# Patient Record
Sex: Female | Born: 2000 | Race: White | Hispanic: Yes | Marital: Single | Smoking: Never smoker
Health system: Southern US, Community
[De-identification: ages and names within clinical notes are randomized; demographics above are authoritative.]

## PROBLEM LIST (undated history)

## (undated) DIAGNOSIS — Z8781 Personal history of (healed) traumatic fracture: Secondary | ICD-10-CM

## (undated) DIAGNOSIS — T7840XA Allergy, unspecified, initial encounter: Secondary | ICD-10-CM

## (undated) HISTORY — DX: Allergy, unspecified, initial encounter: T78.40XA

## (undated) HISTORY — DX: Personal history of (healed) traumatic fracture: Z87.81

## (undated) HISTORY — PX: TONSILLECTOMY: SUR1361

---
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
## 2021-10-21 DIAGNOSIS — S93601A Unspecified sprain of right foot, initial encounter: Secondary | ICD-10-CM | POA: Diagnosis not present

## 2021-10-30 DIAGNOSIS — L930 Discoid lupus erythematosus: Secondary | ICD-10-CM | POA: Diagnosis not present

## 2021-10-30 DIAGNOSIS — Z79899 Other long term (current) drug therapy: Secondary | ICD-10-CM | POA: Diagnosis not present

## 2021-11-13 DIAGNOSIS — L308 Other specified dermatitis: Secondary | ICD-10-CM | POA: Diagnosis not present

## 2022-05-27 ENCOUNTER — Ambulatory Visit (INDEPENDENT_AMBULATORY_CARE_PROVIDER_SITE_OTHER): Payer: BC Managed Care – PPO | Admitting: Nurse Practitioner

## 2022-05-27 ENCOUNTER — Encounter: Payer: Self-pay | Admitting: Nurse Practitioner

## 2022-05-27 VITALS — BP 114/88 | HR 71 | Ht 62.75 in | Wt 201.2 lb

## 2022-05-27 DIAGNOSIS — E669 Obesity, unspecified: Secondary | ICD-10-CM | POA: Diagnosis not present

## 2022-05-27 DIAGNOSIS — R5383 Other fatigue: Secondary | ICD-10-CM

## 2022-05-27 DIAGNOSIS — Z1159 Encounter for screening for other viral diseases: Secondary | ICD-10-CM

## 2022-05-27 DIAGNOSIS — Z114 Encounter for screening for human immunodeficiency virus [HIV]: Secondary | ICD-10-CM

## 2022-05-27 DIAGNOSIS — Z23 Encounter for immunization: Secondary | ICD-10-CM

## 2022-05-27 DIAGNOSIS — R21 Rash and other nonspecific skin eruption: Secondary | ICD-10-CM | POA: Insufficient documentation

## 2022-05-27 MED ORDER — NORETHINDRONE ACET-ETHINYL EST 1-20 MG-MCG PO TABS: 1.0000 | ORAL_TABLET | Freq: Every day | ORAL | 3 refills | Status: AC

## 2022-05-27 NOTE — Patient Instructions (Signed)
It was great to see you!  We are checking your labs today and will let you know the results via mychart/phone.   Depending on your lab results, I may refer you to rheumatology.   Let's follow-up in 1 month, sooner if you have concerns.  If a referral was placed today, you will be contacted for an appointment. Please note that routine referrals can sometimes take up to 3-4 weeks to process. Please call our office if you haven't heard anything after this time frame.  Take care,  Rodman Pickle, NP

## 2022-05-27 NOTE — Assessment & Plan Note (Signed)
BMI 35.9.  Recommend healthy eating and exercise.  We will check A1c today

## 2022-05-27 NOTE — Assessment & Plan Note (Signed)
She is having an ongoing rash to her left cheek.  She saw dermatology and does not remember what cream she was given but she has been using this daily without relief.  She states that she had autoimmune testing done which came back positive, was told she did not need any more follow-up.  She is concerned about this because her sister has a history of lupus.  We will check ANA with reflex today.  If positive will refer to rheumatology.

## 2022-05-27 NOTE — Progress Notes (Signed)
New Patient Visit  BP 114/88 (BP Location: Left Arm, Patient Position: Sitting, Cuff Size: Normal)   Pulse 71   Ht 5' 2.75" (1.594 m)   Wt 201 lb 3.2 oz (91.3 kg)   LMP 05/21/2022 (Approximate)   SpO2 97%   BMI 35.93 kg/m    Subjective:    Patient ID: Brandy Cook, female    DOB: 2001/04/12, 21 y.o.   MRN: [id number]  CC: Chief Complaint  Patient presents with   Establish Care    Np. Est care. Pt wants to discuss recent dx of autoimmune disease. Pt c/o painful period cramps during menstrual cycle, discuss treatment options.     HPI: Brandy Cook is a 21 y.o. female presents for new patient visit to establish care.  Introduced to Publishing rights manager role and practice setting.  All questions answered.  Discussed provider/patient relationship and expectations.  She states that she has been having a rash on her left cheek. She was putting a cream on it, however it seems to have gotten bigger.  She does not remember the name of the medication.  She was following with dermatology and had some autoimmune disease checked and told that it was positive, however she was not told which one.  She was also told that she did not need anymore follow-up on this.  She is concerned because her sister has a history of lupus.  She has also been experiencing joint pain, hair loss, and fatigue.  She states that she has a history of heavy, painful periods.  Her menstrual periods last about a week, and days 3 and 4 are the heaviest.  She states that she cannot get out of bed some days.  She has been taking advil which helps for about an hour, then comes back.  Her menstrual periods are mostly regular, however sometimes they are a week or 2 late.  She is interested in starting a birth control medication to help with her heavy cramping.  Depression and anxiety screen done:     05/27/2022    3:19 PM 06/01/2019    3:10 PM 04/04/2018    9:34 AM 05/11/2016    5:44 PM 09/07/2015   12:05 PM  Depression  screen PHQ 2/9  Decreased Interest 1 0 0 0 0  Down, Depressed, Hopeless 1 0 0 0 0  PHQ - 2 Score 2 0 0 0 0  Altered sleeping 1      Tired, decreased energy 1      Change in appetite 1      Feeling bad or failure about yourself  1      Trouble concentrating 0      Moving slowly or fidgety/restless 0      Suicidal thoughts 0      PHQ-9 Score 6      Difficult doing work/chores Not difficult at all          05/27/2022    3:19 PM  GAD 7 : Generalized Anxiety Score  Nervous, Anxious, on Edge 1  Control/stop worrying 1  Worry too much - different things 1  Trouble relaxing 0  Restless 0  Easily annoyed or irritable 1  Afraid - awful might happen 1  Total GAD 7 Score 5  Anxiety Difficulty Not difficult at all    Past Medical History:  Diagnosis Date   Allergy    H/O forearm fracture 2013   Right arm    Past Surgical History:  Procedure Laterality Date  TONSILLECTOMY      Family History  Problem Relation Age of Onset   Anemia Mother    Alcohol abuse Father    Lupus Sister    Alcohol abuse Paternal Uncle    Diabetes Maternal Grandmother    Alcohol abuse Paternal Grandfather      Social History   Tobacco Use   Smoking status: Never   Smokeless tobacco: Never  Vaping Use   Vaping Use: Never used  Substance Use Topics   Alcohol use: No    Alcohol/week: 0.0 standard drinks of alcohol   Drug use: No    No current outpatient medications on file prior to visit.   No current facility-administered medications on file prior to visit.     Review of Systems  Constitutional:  Positive for fatigue. Negative for fever.  HENT: Negative.    Eyes:  Positive for visual disturbance (especially at night). Negative for pain and redness.  Respiratory: Negative.    Cardiovascular: Negative.   Gastrointestinal: Negative.   Genitourinary: Negative.   Musculoskeletal:  Positive for arthralgias. Negative for joint swelling.  Skin:  Positive for rash (left cheek).   Neurological: Negative.   Psychiatric/Behavioral: Negative.        Objective:    BP 114/88 (BP Location: Left Arm, Patient Position: Sitting, Cuff Size: Normal)   Pulse 71   Ht 5' 2.75" (1.594 m)   Wt 201 lb 3.2 oz (91.3 kg)   LMP 05/21/2022 (Approximate)   SpO2 97%   BMI 35.93 kg/m   Wt Readings from Last 3 Encounters:  05/27/22 201 lb 3.2 oz (91.3 kg)  06/01/19 173 lb 6.4 oz (78.7 kg) (94 %, Z= 1.59)*  04/04/18 163 lb (73.9 kg) (93 %, Z= 1.45)*   * Growth percentiles are based on CDC (Girls, 2-20 Years) data.    BP Readings from Last 3 Encounters:  05/27/22 114/88  06/01/19 128/72  04/04/18 126/74 (95 %, Z = 1.64 /  84 %, Z = 0.99)*   *BP percentiles are based on the 2017 AAP Clinical Practice Guideline for girls    Physical Exam Vitals and nursing note reviewed.  Constitutional:      General: She is not in acute distress.    Appearance: Normal appearance.  HENT:     Head: Normocephalic.     Right Ear: Tympanic membrane, ear canal and external ear normal.     Left Ear: Tympanic membrane, ear canal and external ear normal.  Eyes:     Conjunctiva/sclera: Conjunctivae normal.  Cardiovascular:     Rate and Rhythm: Normal rate and regular rhythm.     Pulses: Normal pulses.     Heart sounds: Normal heart sounds.  Pulmonary:     Effort: Pulmonary effort is normal.     Breath sounds: Normal breath sounds.  Abdominal:     Palpations: Abdomen is soft.     Tenderness: There is no abdominal tenderness.  Musculoskeletal:     Cervical back: Normal range of motion and neck supple. No tenderness.     Right lower leg: No edema.     Left lower leg: No edema.  Lymphadenopathy:     Cervical: No cervical adenopathy.  Skin:    General: Skin is warm.     Findings: Rash (left cheek, flat, red) present.  Neurological:     General: No focal deficit present.     Mental Status: She is alert and oriented to person, place, and time.  Psychiatric:  Mood and Affect: Mood  normal.        Behavior: Behavior normal.        Thought Content: Thought content normal.        Judgment: Judgment normal.       Assessment & Plan:   Problem List Items Addressed This Visit       Musculoskeletal and Integument   Rash - Primary    She is having an ongoing rash to her left cheek.  She saw dermatology and does not remember what cream she was given but she has been using this daily without relief.  She states that she had autoimmune testing done which came back positive, was told she did not need any more follow-up.  She is concerned about this because her sister has a history of lupus.  We will check ANA with reflex today.  If positive will refer to rheumatology.      Relevant Orders   CBC with Differential/Platelet   Comprehensive metabolic panel   ANA w/Reflex     Other   Obesity (BMI 30-39.9)    BMI 35.9.  Recommend healthy eating and exercise.  We will check A1c today      Relevant Orders   Hemoglobin A1c   Other Visit Diagnoses     Fatigue, unspecified type       We will check CMP, CBC, TSH today.  We are also checking ANA for the rash on her cheek.  Encouraged regular exercise and regular sleep   Relevant Orders   CBC with Differential/Platelet   Comprehensive metabolic panel   TSH   Screening for HIV (human immunodeficiency virus)       Screen HIV today   Relevant Orders   HIV Antibody (routine testing w rflx)   Encounter for hepatitis C screening test for low risk patient       Screen hepatitis C today   Relevant Orders   Hepatitis C antibody   Need for meningitis vaccination       First meningitis B vaccine given today.  We will need next vaccine 1 month.   Relevant Orders   Meningococcal B, OMV (Bexsero) (Completed)   Need for influenza vaccination       Flu vaccine given today   Relevant Orders   Flu Vaccine QUAD 6+ mos PF IM (Fluarix Quad PF) (Completed)        Follow up plan: Return in about 4 weeks (around 06/24/2022).

## 2022-05-28 LAB — COMPREHENSIVE METABOLIC PANEL
ALT: 28 U/L (ref 0–35)
AST: 20 U/L (ref 0–37)
Albumin: 4.9 g/dL (ref 3.5–5.2)
Alkaline Phosphatase: 77 U/L (ref 39–117)
BUN: 15 mg/dL (ref 6–23)
CO2: 23 mEq/L (ref 19–32)
Calcium: 10.1 mg/dL (ref 8.4–10.5)
Chloride: 103 mEq/L (ref 96–112)
Creatinine, Ser: 0.63 mg/dL (ref 0.40–1.20)
GFR: 127.74 mL/min (ref >60.00)
Glucose, Bld: 89 mg/dL (ref 70–99)
Potassium: 4 mEq/L (ref 3.5–5.1)
Sodium: 136 mEq/L (ref 135–145)
Total Bilirubin: 0.5 mg/dL (ref 0.2–1.2)
Total Protein: 7.1 g/dL (ref 6.0–8.3)

## 2022-05-28 LAB — CBC WITH DIFFERENTIAL/PLATELET
Basophils Absolute: 0.1 10*3/uL (ref 0.0–0.1)
Basophils Relative: 0.8 % (ref 0.0–3.0)
Eosinophils Absolute: 0 10*3/uL (ref 0.0–0.7)
Eosinophils Relative: 0.7 % (ref 0.0–5.0)
HCT: 41.4 % (ref 36.0–46.0)
Hemoglobin: 14.1 g/dL (ref 12.0–15.0)
Lymphocytes Relative: 22.1 % (ref 12.0–46.0)
Lymphs Abs: 1.6 10*3/uL (ref 0.7–4.0)
MCHC: 34 g/dL (ref 30.0–36.0)
MCV: 81.1 fl (ref 78.0–100.0)
Monocytes Absolute: 0.5 10*3/uL (ref 0.1–1.0)
Monocytes Relative: 7.2 % (ref 3.0–12.0)
Neutro Abs: 4.9 10*3/uL (ref 1.4–7.7)
Neutrophils Relative %: 69.2 % (ref 43.0–77.0)
Platelets: 393 10*3/uL (ref 150.0–400.0)
RBC: 5.11 Mil/uL (ref 3.87–5.11)
RDW: 12.6 % (ref 11.5–14.6)
WBC: 7.1 10*3/uL (ref 4.5–10.5)

## 2022-05-28 LAB — ANA W/REFLEX: Anti Nuclear Antibody (ANA): NEGATIVE

## 2022-05-28 LAB — HIV ANTIBODY (ROUTINE TESTING W REFLEX): HIV 1&2 Ab, 4th Generation: NONREACTIVE

## 2022-05-28 LAB — HEPATITIS C ANTIBODY: Hepatitis C Ab: NONREACTIVE

## 2022-05-28 LAB — TSH: TSH: 2.4 u[IU]/mL (ref 0.35–5.50)

## 2022-05-28 LAB — HEMOGLOBIN A1C: Hgb A1c MFr Bld: 5.2 % (ref 4.6–6.5)

## 2022-06-24 ENCOUNTER — Telehealth: Payer: Self-pay

## 2022-06-24 ENCOUNTER — Encounter: Payer: Self-pay | Admitting: Nurse Practitioner

## 2022-06-24 ENCOUNTER — Ambulatory Visit: Payer: BC Managed Care – PPO | Admitting: Nurse Practitioner

## 2022-06-24 ENCOUNTER — Other Ambulatory Visit (HOSPITAL_COMMUNITY): Payer: Self-pay

## 2022-06-24 VITALS — BP 112/78 | HR 84 | Temp 97.8°F | Wt 203.4 lb

## 2022-06-24 DIAGNOSIS — R768 Other specified abnormal immunological findings in serum: Secondary | ICD-10-CM | POA: Diagnosis not present

## 2022-06-24 DIAGNOSIS — R21 Rash and other nonspecific skin eruption: Secondary | ICD-10-CM

## 2022-06-24 MED ORDER — TACROLIMUS 0.03 % EX OINT: TOPICAL_OINTMENT | Freq: Two times a day (BID) | CUTANEOUS | 0 refills | Status: AC

## 2022-06-24 NOTE — Assessment & Plan Note (Signed)
The rash is still ongoing on her face.  She does not remember the name of the cream that her dermatologist gave her but it did not help.  Will have her try tacrolimus cream to her face twice a day.  She also has a positive ANA on 10/30/2021, however the reflex was all negative.  Her titer was 1: 80 and it showed nuclear, speckled for description.  Will place referral to rheumatology.

## 2022-06-24 NOTE — Telephone Encounter (Signed)
Pharmacy Patient Advocate Encounter   Received notification from [facility] that prior authorization for Tacrolimus 0.03% ointment is required/requested.   PA submitted on 06/24/22 to [facility] [employer] via CoverMyMeds Key [id number]   Status is pending

## 2022-06-24 NOTE — Patient Instructions (Signed)
It was great to see you!  Start tacrolimus ointment twice a day to the spot on your face. I have placed a referral to rheumatology, they will call to schedule.   Let's follow-up in 3 months, sooner if you have concerns.  If a referral was placed today, you will be contacted for an appointment. Please note that routine referrals can sometimes take up to 3-4 weeks to process. Please call our office if you haven't heard anything after this time frame.  Take care,  Rodman Pickle, NP

## 2022-06-24 NOTE — Progress Notes (Signed)
   Established Patient Office Visit  Subjective   Patient ID: Brandy Cook, female    DOB: 12-22-2000  Age: 21 y.o. MRN: [id number]  Chief Complaint  Patient presents with   Follow-up    HPI  Brandy Cook is here to follow-up on the rash on her face. She states that the cream the dermatologist gave her is not improving her symptoms.  She states that last week and was really cold, the rash turned purple.  It does not itch or hurt.  She had a ANA that was positive on 10/30/2021.    ROS See pertinent positives and negatives per HPI.    Objective:     BP 112/78 (BP Location: Right Arm, Patient Position: Sitting, Cuff Size: Large)   Pulse 84   Temp 97.8 F (36.6 C) (Temporal)   Wt 203 lb 6.4 oz (92.3 kg)   LMP 06/22/2022 (Approximate)   SpO2 97%   BMI 36.32 kg/m    Physical Exam Vitals and nursing note reviewed.  Constitutional:      General: She is not in acute distress.    Appearance: Normal appearance.  HENT:     Head: Normocephalic.  Eyes:     Conjunctiva/sclera: Conjunctivae normal.  Cardiovascular:     Rate and Rhythm: Normal rate and regular rhythm.     Pulses: Normal pulses.     Heart sounds: Normal heart sounds.  Pulmonary:     Effort: Pulmonary effort is normal.     Breath sounds: Normal breath sounds.  Musculoskeletal:     Cervical back: Normal range of motion.  Skin:    General: Skin is warm.     Findings: Rash (left cheek) present.  Neurological:     General: No focal deficit present.     Mental Status: She is alert and oriented to person, place, and time.  Psychiatric:        Mood and Affect: Mood normal.        Behavior: Behavior normal.        Thought Content: Thought content normal.        Judgment: Judgment normal.      Assessment & Plan:   Problem List Items Addressed This Visit       Musculoskeletal and Integument   Rash - Primary    The rash is still ongoing on her face.  She does not remember the name of the cream that her  dermatologist gave her but it did not help.  Will have her try tacrolimus cream to her face twice a day.  She also has a positive ANA on 10/30/2021, however the reflex was all negative.  Her titer was 1: 80 and it showed nuclear, speckled for description.  Will place referral to rheumatology.      Relevant Orders   Ambulatory referral to Rheumatology   Other Visit Diagnoses     Positive ANA (antinuclear antibody)       Relevant Orders   Ambulatory referral to Rheumatology       Return in about 3 months (around 09/23/2022) for CPE.    Gerre Scull, NP

## 2022-06-25 ENCOUNTER — Other Ambulatory Visit (HOSPITAL_COMMUNITY): Payer: Self-pay

## 2022-06-25 NOTE — Telephone Encounter (Signed)
Pharmacy Patient Advocate Encounter  Prior Authorization for Tacrolimus 0.03% Ointment has been approved.    PA# [id number] Effective dates: 06/24/2022 through 06/23/2023

## 2022-12-29 NOTE — Progress Notes (Deleted)
Office Visit Note  Patient: Brandy Cook             Date of Birth: 2001-01-28           MRN: [id number]             PCP: Patient, No Pcp Per Referring: Gerre Scull, NP Visit Date: 01/12/2023 Occupation: @GUAROCC @  Subjective:  No chief complaint on file.   History of Present Illness: Brandy Cook is a 22 y.o. female ***     Activities of Daily Living:  Patient reports morning stiffness for *** {minute/hour:19697}.   Patient {ACTIONS;DENIES/REPORTS:21021675::"Denies"} nocturnal pain.  Difficulty dressing/grooming: {ACTIONS;DENIES/REPORTS:21021675::"Denies"} Difficulty climbing stairs: {ACTIONS;DENIES/REPORTS:21021675::"Denies"} Difficulty getting out of chair: {ACTIONS;DENIES/REPORTS:21021675::"Denies"} Difficulty using hands for taps, buttons, cutlery, and/or writing: {ACTIONS;DENIES/REPORTS:21021675::"Denies"}  No Rheumatology ROS completed.   PMFS History:  Patient Active Problem List   Diagnosis Date Noted   Rash 05/27/2022   Obesity (BMI 30-39.9) 05/27/2022    Past Medical History:  Diagnosis Date   Allergy    H/O forearm fracture 2013   Right arm    Family History  Problem Relation Age of Onset   Anemia Mother    Alcohol abuse Father    Lupus Sister    Alcohol abuse Paternal Uncle    Diabetes Maternal Grandmother    Alcohol abuse Paternal Grandfather    Past Surgical History:  Procedure Laterality Date   TONSILLECTOMY     Social History   Social History Narrative   Not on file   Immunization History  Administered Date(s) Administered   COVID-19, mRNA, vaccine(Comirnaty)12 years and older 10/25/2019, 05/28/2020   DTaP 03/22/2002, 05/23/2002, 07/04/2002, 04/09/2003, 03/01/2006   HIB (PRP-OMP) 03/22/2002, 05/23/2002, 07/04/2002, 01/15/2003   HPV 9-valent 04/25/2019, 08/11/2019, 12/06/2019   Hepatitis A, Ped/Adol-2 Dose 03/01/2006, 02/02/2007   Hepatitis B, PED/ADOLESCENT 11/01/01, 10/12/2002   Influenza,inj,Quad PF,6+ Mos  05/11/2016, 04/04/2018, 05/27/2022   Influenza-Unspecified 04/14/2019   MMR 01/15/2003, 03/01/2006   MenQuadfi_Meningococcal Groups ACYW Conjugate 03/27/2014, 04/25/2019   Meningococcal B, OMV 05/27/2022   Pneumococcal Conjugate-13 07/04/2002, 04/09/2003   Pneumococcal-Unspecified 03/22/2002, 05/23/2002   Td 08/12/2012   Tdap 08/12/2012   Vaccinia,smallpox Monkeypox Vaccine Live,pf 04/09/2003, 03/01/2006     Objective: Vital Signs: There were no vitals taken for this visit.   Physical Exam   Musculoskeletal Exam: ***  CDAI Exam: CDAI Score: -- Patient Global: --; Provider Global: -- Swollen: --; Tender: -- Joint Exam 01/12/2023   No joint exam has been documented for this visit   There is currently no information documented on the homunculus. Go to the Rheumatology activity and complete the homunculus joint exam.  Investigation: No additional findings.  Imaging: No results found.  Recent Labs: Lab Results  Component Value Date   WBC 7.1 05/27/2022   HGB 14.1 05/27/2022   PLT 393.0 05/27/2022   NA 136 05/27/2022   K 4.0 05/27/2022   CL 103 05/27/2022   CO2 23 05/27/2022   GLUCOSE 89 05/27/2022   BUN 15 05/27/2022   CREATININE 0.63 05/27/2022   BILITOT 0.5 05/27/2022   ALKPHOS 77 05/27/2022   AST 20 05/27/2022   ALT 28 05/27/2022   PROT 7.1 05/27/2022   ALBUMIN 4.9 05/27/2022   CALCIUM 10.1 05/27/2022    Speciality Comments: No specialty comments available.  Procedures:  No procedures performed Allergies: Penicillins   Assessment / Plan:     Visit Diagnoses: Rash  Positive ANA (antinuclear antibody) - ANA negative on 05/27/22  Orders: No orders of the defined types  were placed in this encounter.  No orders of the defined types were placed in this encounter.   Face-to-face time spent with patient was *** minutes. Greater than 50% of time was spent in counseling and coordination of care.  Follow-Up Instructions: No follow-ups on file.   Gearldine Bienenstock, PA-C  Note - This record has been created using Dragon software.  Chart creation errors have been sought, but may not always  have been located. Such creation errors do not reflect on  the standard of medical care.

## 2023-01-12 ENCOUNTER — Encounter: Payer: BC Managed Care – PPO | Admitting: Rheumatology

## 2023-01-12 DIAGNOSIS — R21 Rash and other nonspecific skin eruption: Secondary | ICD-10-CM

## 2023-01-12 DIAGNOSIS — R768 Other specified abnormal immunological findings in serum: Secondary | ICD-10-CM

## 2023-01-26 NOTE — Progress Notes (Addendum)
Office Visit Note  Patient: Brandy Cook             Date of Birth: Oct 05, 2000           MRN: [id number]             PCP: Patient, No Pcp Per Referring: Gerre Scull, NP Visit Date: 02/03/2023 Occupation: @GUAROCC @  Subjective:  Rash and positive ANA  History of Present Illness: Brandy Cook is a 21 y.o. female seen in consultation per request of her PCP.  According the patient in 2022 she noticed a rash on her left cheek she initially thought it was an insect bite.  She states over the next year the rash is spread and became larger.  She went to see [facility] dermatology about 1 year ago.  She states at that time she had lab work but no diagnosis was given.  She was also given a topical agent to use.  After that she started seeing her primary care physician and had lab work again.  She initially had positive ANA which became negative later.  She was given tacrolimus topical agent which she has been using November 2023 but has not noticed any improvement.  She states in March 2024 she went to [place] where she had laser treatment on the rash x 2 she noticed that the rash became less purple and appeared more pink.  She was also given some topical agents which she has been using occasionally.  She denies any other rash.  There is no history of photosensitivity.  She notices some stiffness in her bilateral wrist joints and her feet.  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  She likes to walk for exercise.  There is no history of oral ulcers, nasal ulcers, sicca symptoms, malar rash, photosensitivity, Raynaud's phenomenon or lymphadenopathy.  There is no history of inflammatory arthritis.  Her sister was diagnosed with systemic lupus at age 1.  There are no other family members with lupus or autoimmune disease.  She is gravida 0.  She is on oral contraceptive pills.    Activities of Daily Living:  Patient  reports morning stiffness for 10 minutes.   Patient Reports nocturnal pain.  Difficulty dressing/grooming: Denies Difficulty climbing stairs: Reports Difficulty getting out of chair: Reports Difficulty using hands for taps, buttons, cutlery, and/or writing: Denies  Review of Systems  Constitutional:  Positive for fatigue.  HENT:  Negative for mouth sores and mouth dryness.   Eyes:  Negative for dryness.  Respiratory:  Negative for shortness of breath.   Cardiovascular:  Negative for chest pain and palpitations.  Gastrointestinal:  Negative for blood in stool, constipation and diarrhea.  Endocrine: Negative for increased urination.  Genitourinary:  Negative for involuntary urination.  Musculoskeletal:  Positive for joint pain, joint pain and morning stiffness. Negative for gait problem, joint swelling, myalgias, muscle weakness, muscle tenderness and myalgias.  Skin:  Positive for rash and hair loss. Negative for color change and sensitivity to sunlight.  Allergic/Immunologic: Negative for susceptible to infections.  Neurological:  Negative for dizziness and headaches.  Hematological:  Negative for swollen glands.  Psychiatric/Behavioral:  Positive for sleep disturbance. Negative for depressed mood. The patient is not nervous/anxious.     PMFS History:  Patient Active Problem List   Diagnosis Date Noted   Rash 05/27/2022   Obesity (BMI 30-39.9) 05/27/2022    Past Medical  History:  Diagnosis Date   Allergy    H/O forearm fracture 2013   Right arm    Family History  Problem Relation Age of Onset   Anemia Mother    Alcohol abuse Father    Lupus Sister    Alcohol abuse Paternal Uncle    Diabetes Maternal Grandmother    Alcohol abuse Paternal Grandfather    Past Surgical History:  Procedure Laterality Date   TONSILLECTOMY     Social History   Social History Narrative   Not on file   Immunization History  Administered Date(s) Administered   COVID-19, mRNA,  vaccine(Comirnaty)12 years and older 10/25/2019, 05/28/2020   DTaP 03/22/2002, 05/23/2002, 07/04/2002, 04/09/2003, 03/01/2006   HIB (PRP-OMP) 03/22/2002, 05/23/2002, 07/04/2002, 01/15/2003   HPV 9-valent 04/25/2019, 08/11/2019, 12/06/2019   Hepatitis A, Ped/Adol-2 Dose 03/01/2006, 02/02/2007   Hepatitis B, PED/ADOLESCENT 05/02/2002, 10/12/2002   Influenza,inj,Quad PF,6+ Mos 05/11/2016, 04/04/2018, 05/27/2022   Influenza-Unspecified 04/14/2019   MMR 01/15/2003, 03/01/2006   MenQuadfi_Meningococcal Groups ACYW Conjugate 03/27/2014, 04/25/2019   Meningococcal B, OMV 05/27/2022   Pneumococcal Conjugate-13 07/04/2002, 04/09/2003   Pneumococcal-Unspecified 03/22/2002, 05/23/2002   Td 08/12/2012   Tdap 08/12/2012   Vaccinia,smallpox Monkeypox Vaccine Live,pf 04/09/2003, 03/01/2006     Objective: Vital Signs: BP 129/82 (BP Location: Right Arm, Patient Position: Sitting, Cuff Size: Normal)   Pulse (!) 59   Resp 15   Ht 5' 2.5" (1.588 m)   Wt 201 lb 3.2 oz (91.3 kg)   BMI 36.21 kg/m    Physical Exam Vitals and nursing note reviewed.  Constitutional:      Appearance: She is well-developed.  HENT:     Head: Normocephalic and atraumatic.  Eyes:     Conjunctiva/sclera: Conjunctivae normal.  Cardiovascular:     Rate and Rhythm: Normal rate and regular rhythm.     Heart sounds: Normal heart sounds.  Pulmonary:     Effort: Pulmonary effort is normal.     Breath sounds: Normal breath sounds.  Abdominal:     General: Bowel sounds are normal.     Palpations: Abdomen is soft.  Musculoskeletal:     Cervical back: Normal range of motion.  Lymphadenopathy:     Cervical: No cervical adenopathy.  Skin:    General: Skin is warm and dry.     Capillary Refill: Capillary refill takes less than 2 seconds.     Comments: Erythematous macular rash was noted on the left cheek with irregular margins.    Neurological:     Mental Status: She is alert and oriented to person, place, and time.   Psychiatric:        Behavior: Behavior normal.      Musculoskeletal Exam: Cervical, lumbar spine were in good range of motion.  Shoulder joints, elbow joints, wrist joints, MCPs PIPs and DIPs were in good range of motion with no synovitis.  Hip joints, knee joints, ankles, MTPs and PIPs been good range of motion with no synovitis.  CDAI Exam: CDAI Score: -- Patient Global: --; Provider Global: -- Swollen: --; Tender: -- Joint Exam 02/03/2023   No joint exam has been documented for this visit   There is currently no information documented on the homunculus. Go to the Rheumatology activity and complete the homunculus joint exam.  Investigation: No additional findings.  Imaging: No results found.  Recent Labs: Lab Results  Component Value Date   WBC 7.1 05/27/2022   HGB 14.1 05/27/2022   PLT 393.0 05/27/2022   NA 136 05/27/2022  K 4.0 05/27/2022   CL 103 05/27/2022   CO2 23 05/27/2022   GLUCOSE 89 05/27/2022   BUN 15 05/27/2022   CREATININE 0.63 05/27/2022   BILITOT 0.5 05/27/2022   ALKPHOS 77 05/27/2022   AST 20 05/27/2022   ALT 28 05/27/2022   PROT 7.1 05/27/2022   ALBUMIN 4.9 05/27/2022   CALCIUM 10.1 05/27/2022   May 27, 2022 ANA negative, hepatitis C negative, HIV negative, TSH normal, hemoglobin A1c 5.2  October 30, 2021 ANA 1: 80NS, ENA negative per office visit note.  I do not have the lab results available.  Speciality Comments: No specialty comments available.  Procedures:  No procedures performed Allergies: Penicillins   Assessment / Plan:     Visit Diagnoses: Rash-patient noticed a rash on her left cheek in 2022.  She states the rash gradually increased in size.  In 2023 she was seen at [facility] dermatology.  Patient states that she was given no diagnosis and some labs were obtained.  She was given an topical agent which did not help.  Patient did not go for follow-up visit.  She has not noticed rash anywhere on her body.  There is no history of  photosensitivity or Raynaud's.  Patient went to [place] in March 2024.  She states she was seen by a doctor there who treated the rash with laser x 2 and the rash became less purple.  She was also given 2 different topical agents which she has been using occasionally.  She continues to use the tacrolimus which was prescribed by her PCP.  She also uses sunscreen.  I will request records from [facility] dermatology.  Positive ANA (antinuclear antibody) -patient had low titer positive ANA in April 2023 which became negative.  She is concerned about lupus as her sister has lupus.  She denies any history of oral ulcers, nasal ulcers, sicca symptoms, malar rash, Raynaud's phenomenon, photosensitivity or lymphadenopathy.  There is no history of inflammatory arthritis.  She states she has had more hair loss in the last 1 year.  No hair thinning was noted.  She also gives history of fatigue.  Will repeat autoimmune labs today.  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   She states she has some discomfort in her ankles and her bilateral wrist joints towards the end of the day.  She also has morning stiffness.  No synovitis was noted on the examination today.  Plan: Rheumatoid factor, Cyclic citrul peptide antibody, IgG  Other fatigue -she complains of ongoing fatigue for the last few years.  Plan: CBC with Differential/Platelet, COMPLETE METABOLIC PANEL WITH GFR, Sedimentation rate, CK  Family history of systemic lupus erythematosus-sister.  Patient does not know the details of her sister's symptoms and treatment.    Orders: Orders Placed This Encounter  Procedures   CBC with Differential/Platelet   COMPLETE METABOLIC PANEL WITH GFR    Sedimentation rate   CK   Rheumatoid factor   Cyclic citrul peptide antibody, IgG   ANA   Anti-DNA antibody, double-stranded   Sjogrens syndrome-B extractable nuclear antibody   Sjogrens syndrome-A extractable nuclear antibody   Anti-Smith antibody   RNP Antibody   Anti-scleroderma antibody   C3 and C4   No orders of the defined types were placed in this encounter.  Follow-Up Instructions: Return for Rash, positive ANA.   Pollyann Savoy, MD  Note - This record has been created using Animal nutritionist.  Chart creation errors have been sought, but may not always  have been located. Such creation errors do not reflect on  the standard of medical care.

## 2023-02-03 ENCOUNTER — Encounter: Payer: Self-pay | Admitting: Rheumatology

## 2023-02-03 ENCOUNTER — Ambulatory Visit: Payer: BC Managed Care – PPO | Attending: Rheumatology | Admitting: Rheumatology

## 2023-02-03 VITALS — BP 129/82 | HR 59 | Resp 15 | Ht 62.5 in | Wt 201.2 lb

## 2023-02-03 DIAGNOSIS — R5383 Other fatigue: Secondary | ICD-10-CM | POA: Diagnosis not present

## 2023-02-03 DIAGNOSIS — R768 Other specified abnormal immunological findings in serum: Secondary | ICD-10-CM

## 2023-02-03 DIAGNOSIS — R21 Rash and other nonspecific skin eruption: Secondary | ICD-10-CM | POA: Diagnosis not present

## 2023-02-03 DIAGNOSIS — M2550 Pain in unspecified joint: Secondary | ICD-10-CM

## 2023-02-03 DIAGNOSIS — Z8269 Family history of other diseases of the musculoskeletal system and connective tissue: Secondary | ICD-10-CM

## 2023-02-04 ENCOUNTER — Ambulatory Visit: Payer: BC Managed Care – PPO | Admitting: Rheumatology

## 2023-02-05 LAB — COMPLETE METABOLIC PANEL WITH GFR
AG Ratio: 2 (calc) (ref 1.0–2.5)
ALT: 13 U/L (ref 6–29)
AST: 13 U/L (ref 10–30)
Albumin: 4.6 g/dL (ref 3.6–5.1)
Alkaline phosphatase (APISO): 69 U/L (ref 31–125)
BUN: 12 mg/dL (ref 7–25)
CO2: 23 mmol/L (ref 20–32)
Calcium: 9.7 mg/dL (ref 8.6–10.2)
Chloride: 107 mmol/L (ref 98–110)
Creat: 0.67 mg/dL (ref 0.50–0.96)
Globulin: 2.3 g/dL (calc) (ref 1.9–3.7)
Glucose, Bld: 87 mg/dL (ref 65–99)
Potassium: 4.3 mmol/L (ref 3.5–5.3)
Sodium: 139 mmol/L (ref 135–146)
Total Bilirubin: 0.4 mg/dL (ref 0.2–1.2)
Total Protein: 6.9 g/dL (ref 6.1–8.1)
eGFR: 127 mL/min/{1.73_m2} (ref >=60)

## 2023-02-05 LAB — CBC WITH DIFFERENTIAL/PLATELET
Absolute Monocytes: 369 cells/uL (ref 200–950)
Basophils Absolute: 11 cells/uL (ref 0–200)
Basophils Relative: 0.2 %
Eosinophils Absolute: 50 cells/uL (ref 15–500)
Eosinophils Relative: 0.9 %
HCT: 41.3 % (ref 35.0–45.0)
Hemoglobin: 14 g/dL (ref 11.7–15.5)
Lymphs Abs: 1656 cells/uL (ref 850–3900)
MCH: 27.9 pg (ref 27.0–33.0)
MCHC: 33.9 g/dL (ref 32.0–36.0)
MCV: 82.3 fL (ref 80.0–100.0)
MPV: 9.4 fL (ref 7.5–12.5)
Monocytes Relative: 6.7 %
Neutro Abs: 3416 cells/uL (ref 1500–7800)
Neutrophils Relative %: 62.1 %
Platelets: 333 10*3/uL (ref 140–400)
RBC: 5.02 10*6/uL (ref 3.80–5.10)
RDW: 12.2 % (ref 11.0–15.0)
Total Lymphocyte: 30.1 %
WBC: 5.5 10*3/uL (ref 3.8–10.8)

## 2023-02-05 LAB — SJOGRENS SYNDROME-B EXTRACTABLE NUCLEAR ANTIBODY: SSB (La) (ENA) Antibody, IgG: <1 AI

## 2023-02-05 LAB — ANTI-SCLERODERMA ANTIBODY: Scleroderma (Scl-70) (ENA) Antibody, IgG: <1 AI

## 2023-02-05 LAB — SEDIMENTATION RATE: Sed Rate: 6 mm/h (ref 0–20)

## 2023-02-05 LAB — ANTI-DNA ANTIBODY, DOUBLE-STRANDED: ds DNA Ab: 9 IU/mL — ABNORMAL HIGH

## 2023-02-05 LAB — SJOGRENS SYNDROME-A EXTRACTABLE NUCLEAR ANTIBODY: SSA (Ro) (ENA) Antibody, IgG: <1 AI

## 2023-02-05 LAB — ANA: Anti Nuclear Antibody (ANA): NEGATIVE

## 2023-02-05 LAB — ANTI-SMITH ANTIBODY: ENA SM Ab Ser-aCnc: <1 AI

## 2023-02-05 LAB — CYCLIC CITRUL PEPTIDE ANTIBODY, IGG: Cyclic Citrullin Peptide Ab: <16 UNITS

## 2023-02-05 LAB — CK: Total CK: 51 U/L (ref 29–143)

## 2023-02-05 LAB — RHEUMATOID FACTOR: Rheumatoid fact SerPl-aCnc: <10 IU/mL (ref <14)

## 2023-02-05 LAB — RNP ANTIBODY: Ribonucleic Protein(ENA) Antibody, IgG: <1 AI

## 2023-02-05 LAB — C3 AND C4
C3 Complement: 147 mg/dL (ref 83–193)
C4 Complement: 26 mg/dL (ref 15–57)

## 2023-02-05 NOTE — Progress Notes (Signed)
Will discuss results at the follow-up visit.

## 2023-02-16 NOTE — Progress Notes (Deleted)
Office Visit Note  Patient: Brandy Cook             Date of Birth: Sep 25, 2000           MRN: [id number]             PCP: Patient, No Pcp Per Referring: No ref. provider found Visit Date: 03/02/2023 Occupation: @GUAROCC @  Subjective:  No chief complaint on file.   History of Present Illness: Brandy Cook is a 22 y.o. female ***     Activities of Daily Living:  Patient reports morning stiffness for *** {minute/hour:19697}.   Patient {ACTIONS;DENIES/REPORTS:21021675::"Denies"} nocturnal pain.  Difficulty dressing/grooming: {ACTIONS;DENIES/REPORTS:21021675::"Denies"} Difficulty climbing stairs: {ACTIONS;DENIES/REPORTS:21021675::"Denies"} Difficulty getting out of chair: {ACTIONS;DENIES/REPORTS:21021675::"Denies"} Difficulty using hands for taps, buttons, cutlery, and/or writing: {ACTIONS;DENIES/REPORTS:21021675::"Denies"}  No Rheumatology ROS completed.   PMFS History:  Patient Active Problem List   Diagnosis Date Noted   Rash 05/27/2022   Obesity (BMI 30-39.9) 05/27/2022    Past Medical History:  Diagnosis Date   Allergy    H/O forearm fracture 2013   Right arm    Family History  Problem Relation Age of Onset   Anemia Mother    Alcohol abuse Father    Lupus Sister    Alcohol abuse Paternal Uncle    Diabetes Maternal Grandmother    Alcohol abuse Paternal Grandfather    Past Surgical History:  Procedure Laterality Date   TONSILLECTOMY     Social History   Social History Narrative   Not on file   Immunization History  Administered Date(s) Administered   COVID-19, mRNA, vaccine(Comirnaty)12 years and older 10/25/2019, 05/28/2020   DTaP 03/22/2002, 05/23/2002, 07/04/2002, 04/09/2003, 03/01/2006   HIB (PRP-OMP) 03/22/2002, 05/23/2002, 07/04/2002, 01/15/2003   HPV 9-valent 04/25/2019, 08/11/2019, 12/06/2019   Hepatitis A, Ped/Adol-2 Dose 03/01/2006, 02/02/2007   Hepatitis B, PED/ADOLESCENT 03/11/2002, 10/12/2002   Influenza,inj,Quad PF,6+ Mos  05/11/2016, 04/04/2018, 05/27/2022   Influenza-Unspecified 04/14/2019   MMR 01/15/2003, 03/01/2006   MenQuadfi_Meningococcal Groups ACYW Conjugate 03/27/2014, 04/25/2019   Meningococcal B, OMV 05/27/2022   Pneumococcal Conjugate-13 07/04/2002, 04/09/2003   Pneumococcal-Unspecified 03/22/2002, 05/23/2002   Td 08/12/2012   Tdap 08/12/2012   Vaccinia,smallpox Monkeypox Vaccine Live,pf 04/09/2003, 03/01/2006     Objective: Vital Signs: There were no vitals taken for this visit.   Physical Exam   Musculoskeletal Exam: ***  CDAI Exam: CDAI Score: -- Patient Global: --; Provider Global: -- Swollen: --; Tender: -- Joint Exam 03/02/2023   No joint exam has been documented for this visit   There is currently no information documented on the homunculus. Go to the Rheumatology activity and complete the homunculus joint exam.  Investigation: No additional findings.  Imaging: No results found.  Recent Labs: Lab Results  Component Value Date   WBC 5.5 02/03/2023   HGB 14.0 02/03/2023   PLT 333 02/03/2023   NA 139 02/03/2023   K 4.3 02/03/2023   CL 107 02/03/2023   CO2 23 02/03/2023   GLUCOSE 87 02/03/2023   BUN 12 02/03/2023   CREATININE 0.67 02/03/2023   BILITOT 0.4 02/03/2023   ALKPHOS 77 05/27/2022   AST 13 02/03/2023   ALT 13 02/03/2023   PROT 6.9 02/03/2023   ALBUMIN 4.9 05/27/2022   CALCIUM 9.7 02/03/2023   February 03, 2023 sed rate 6, CK 51, RF negative, anti-CCP negative, ANA negative, dsDNA 9, (SSA, SSB, Smith, RNP, SCL 70 negative), C3-C4 normal Speciality Comments: No specialty comments available.  Procedures:  No procedures performed Allergies: Penicillins   Assessment / Plan:  Visit Diagnoses: No diagnosis found.  Orders: No orders of the defined types were placed in this encounter.  No orders of the defined types were placed in this encounter.   Face-to-face time spent with patient was *** minutes. Greater than 50% of time was spent in counseling  and coordination of care.  Follow-Up Instructions: No follow-ups on file.   Pollyann Savoy, MD  Note - This record has been created using Animal nutritionist.  Chart creation errors have been sought, but may not always  have been located. Such creation errors do not reflect on  the standard of medical care.

## 2023-03-02 ENCOUNTER — Ambulatory Visit: Payer: BC Managed Care – PPO | Admitting: Rheumatology

## 2023-03-02 DIAGNOSIS — M2550 Pain in unspecified joint: Secondary | ICD-10-CM

## 2023-03-02 DIAGNOSIS — R768 Other specified abnormal immunological findings in serum: Secondary | ICD-10-CM

## 2023-03-02 DIAGNOSIS — R21 Rash and other nonspecific skin eruption: Secondary | ICD-10-CM

## 2023-03-02 DIAGNOSIS — R5383 Other fatigue: Secondary | ICD-10-CM

## 2023-03-02 DIAGNOSIS — Z8269 Family history of other diseases of the musculoskeletal system and connective tissue: Secondary | ICD-10-CM

## 2023-04-25 NOTE — Progress Notes (Deleted)
Office Visit Note  Patient: Brandy Cook             Date of Birth: 2001/01/02           MRN: [id number]             PCP: Patient, No Pcp Per Referring: No ref. provider found Visit Date: 05/06/2023 Occupation: @GUAROCC @  Subjective:  No chief complaint on file.   History of Present Illness: Brandy Cook is a 22 y.o. female ***     Activities of Daily Living:  Patient reports morning stiffness for *** {minute/hour:19697}.   Patient {ACTIONS;DENIES/REPORTS:21021675::"Denies"} nocturnal pain.  Difficulty dressing/grooming: {ACTIONS;DENIES/REPORTS:21021675::"Denies"} Difficulty climbing stairs: {ACTIONS;DENIES/REPORTS:21021675::"Denies"} Difficulty getting out of chair: {ACTIONS;DENIES/REPORTS:21021675::"Denies"} Difficulty using hands for taps, buttons, cutlery, and/or writing: {ACTIONS;DENIES/REPORTS:21021675::"Denies"}  No Rheumatology ROS completed.   PMFS History:  Patient Active Problem List   Diagnosis Date Noted   Rash 05/27/2022   Obesity (BMI 30-39.9) 05/27/2022    Past Medical History:  Diagnosis Date   Allergy    H/O forearm fracture 2013   Right arm    Family History  Problem Relation Age of Onset   Anemia Mother    Alcohol abuse Father    Lupus Sister    Alcohol abuse Paternal Uncle    Diabetes Maternal Grandmother    Alcohol abuse Paternal Grandfather    Past Surgical History:  Procedure Laterality Date   TONSILLECTOMY     Social History   Social History Narrative   Not on file   Immunization History  Administered Date(s) Administered   DTaP 03/22/2002, 05/23/2002, 07/04/2002, 04/09/2003, 03/01/2006   HIB (PRP-OMP) 03/22/2002, 05/23/2002, 07/04/2002, 01/15/2003   HPV 9-valent 04/25/2019, 08/11/2019, 12/06/2019   Hepatitis A, Ped/Adol-2 Dose 03/01/2006, 02/02/2007   Hepatitis B, PED/ADOLESCENT 12/19/01, 10/12/2002   Influenza,inj,Quad PF,6+ Mos 05/11/2016, 04/04/2018, 05/27/2022   Influenza-Unspecified 04/14/2019   MMR  01/15/2003, 03/01/2006   MenQuadfi_Meningococcal Groups ACYW Conjugate 03/27/2014, 04/25/2019   Meningococcal B, OMV 05/27/2022   Pfizer(Comirnaty)Fall Seasonal Vaccine 12 years and older 10/25/2019, 05/28/2020   Pneumococcal Conjugate-13 07/04/2002, 04/09/2003   Pneumococcal-Unspecified 03/22/2002, 05/23/2002   Td 08/12/2012   Tdap 08/12/2012   Vaccinia,smallpox Monkeypox Vaccine Live,pf 04/09/2003, 03/01/2006     Objective: Vital Signs: There were no vitals taken for this visit.   Physical Exam   Musculoskeletal Exam: ***  CDAI Exam: CDAI Score: -- Patient Global: --; Provider Global: -- Swollen: --; Tender: -- Joint Exam 05/06/2023   No joint exam has been documented for this visit   There is currently no information documented on the homunculus. Go to the Rheumatology activity and complete the homunculus joint exam.  Investigation: No additional findings.  Imaging: No results found.  Recent Labs: Lab Results  Component Value Date   WBC 5.5 02/03/2023   HGB 14.0 02/03/2023   PLT 333 02/03/2023   NA 139 02/03/2023   K 4.3 02/03/2023   CL 107 02/03/2023   CO2 23 02/03/2023   GLUCOSE 87 02/03/2023   BUN 12 02/03/2023   CREATININE 0.67 02/03/2023   BILITOT 0.4 02/03/2023   ALKPHOS 77 05/27/2022   AST 13 02/03/2023   ALT 13 02/03/2023   PROT 6.9 02/03/2023   ALBUMIN 4.9 05/27/2022   CALCIUM 9.7 02/03/2023   February 03, 2023 ESR 6, CK 51, RF negative, anti-CCP negative, ANA negative, dsDNA 9 (indeterminate), SSA negative, SSB negative, Smith negative, RNP negative, SCL 70 negative, C3-C4 normal  Speciality Comments: No specialty comments available.  Procedures:  No procedures performed Allergies: Penicillins  Assessment / Plan:     Visit Diagnoses: No diagnosis found.  Orders: No orders of the defined types were placed in this encounter.  No orders of the defined types were placed in this encounter.   Face-to-face time spent with patient was ***  minutes. Greater than 50% of time was spent in counseling and coordination of care.  Follow-Up Instructions: No follow-ups on file.   Pollyann Savoy, MD  Note - This record has been created using Animal nutritionist.  Chart creation errors have been sought, but may not always  have been located. Such creation errors do not reflect on  the standard of medical care.

## 2023-05-06 ENCOUNTER — Ambulatory Visit: Payer: BC Managed Care – PPO | Admitting: Rheumatology

## 2023-05-06 DIAGNOSIS — Z8269 Family history of other diseases of the musculoskeletal system and connective tissue: Secondary | ICD-10-CM

## 2023-05-06 DIAGNOSIS — R5383 Other fatigue: Secondary | ICD-10-CM

## 2023-05-06 DIAGNOSIS — M2550 Pain in unspecified joint: Secondary | ICD-10-CM

## 2023-05-06 DIAGNOSIS — R768 Other specified abnormal immunological findings in serum: Secondary | ICD-10-CM

## 2023-05-06 DIAGNOSIS — R21 Rash and other nonspecific skin eruption: Secondary | ICD-10-CM

## 2023-05-17 ENCOUNTER — Ambulatory Visit: Payer: BC Managed Care – PPO | Admitting: Nurse Practitioner

## 2023-07-04 ENCOUNTER — Other Ambulatory Visit: Payer: Self-pay | Admitting: Nurse Practitioner

## 2023-07-06 NOTE — Telephone Encounter (Signed)
Requesting: NORETHINDRONE ACET/ETH 1/20 TB 21'S  Last Visit: 06/24/2022 Next Visit: Visit date not found Last Refill: 05/27/2022  Please Advise

## 2023-07-07 NOTE — Telephone Encounter (Signed)
LVM for patient to return call. 

## 2023-07-07 NOTE — Telephone Encounter (Signed)
 LVM for patient to return call.,

## 2023-07-09 NOTE — Telephone Encounter (Signed)
LVM to return call.

## 2023-07-12 NOTE — Telephone Encounter (Signed)
LVM to return call.

## 2023-07-15 NOTE — Telephone Encounter (Signed)
LVM to return call.

## 2024-05-24 DIAGNOSIS — H52223 Regular astigmatism, bilateral: Secondary | ICD-10-CM | POA: Diagnosis not present

## 2024-05-24 DIAGNOSIS — Z135 Encounter for screening for eye and ear disorders: Secondary | ICD-10-CM | POA: Diagnosis not present

## 2024-05-24 DIAGNOSIS — H5213 Myopia, bilateral: Secondary | ICD-10-CM | POA: Diagnosis not present
